# Patient Record
Sex: Male | Born: 1970 | Race: White | Hispanic: No | Marital: Married | Smoking: Never smoker
Health system: Southern US, Community
[De-identification: ages and names within clinical notes are randomized; demographics above are authoritative.]

## PROBLEM LIST (undated history)

## (undated) DIAGNOSIS — Z8719 Personal history of other diseases of the digestive system: Secondary | ICD-10-CM

## (undated) DIAGNOSIS — Z9889 Other specified postprocedural states: Secondary | ICD-10-CM

## (undated) DIAGNOSIS — S62609A Fracture of unspecified phalanx of unspecified finger, initial encounter for closed fracture: Secondary | ICD-10-CM

## (undated) DIAGNOSIS — D179 Benign lipomatous neoplasm, unspecified: Secondary | ICD-10-CM

## (undated) DIAGNOSIS — I861 Scrotal varices: Secondary | ICD-10-CM

## (undated) DIAGNOSIS — R079 Chest pain, unspecified: Secondary | ICD-10-CM

## (undated) DIAGNOSIS — M940 Chondrocostal junction syndrome [Tietze]: Secondary | ICD-10-CM

## (undated) HISTORY — PX: MANDIBLE FRACTURE SURGERY: SHX706

## (undated) HISTORY — DX: Chondrocostal junction syndrome (tietze): M94.0

## (undated) HISTORY — DX: Other specified postprocedural states: Z98.890

## (undated) HISTORY — DX: Benign lipomatous neoplasm, unspecified: D17.9

## (undated) HISTORY — DX: Scrotal varices: I86.1

## (undated) HISTORY — DX: Personal history of other diseases of the digestive system: Z87.19

## (undated) HISTORY — DX: Chest pain, unspecified: R07.9

---

## 2011-01-08 ENCOUNTER — Ambulatory Visit (HOSPITAL_BASED_OUTPATIENT_CLINIC_OR_DEPARTMENT_OTHER)
Admission: RE | Admit: 2011-01-08 | Discharge: 2011-01-08 | Disposition: A | Discharge status: Home / Self Care | Payer: BC Managed Care – PPO | Source: Ambulatory Visit | Attending: Orthopedic Surgery | Admitting: Orthopedic Surgery

## 2011-01-08 DIAGNOSIS — S61409A Unspecified open wound of unspecified hand, initial encounter: Secondary | ICD-10-CM | POA: Insufficient documentation

## 2011-01-08 DIAGNOSIS — W298XXA Contact with other powered powered hand tools and household machinery, initial encounter: Secondary | ICD-10-CM | POA: Insufficient documentation

## 2011-01-08 DIAGNOSIS — Z01812 Encounter for preprocedural laboratory examination: Secondary | ICD-10-CM | POA: Insufficient documentation

## 2011-01-08 LAB — POCT HEMOGLOBIN-HEMACUE: Hemoglobin: 14.9 g/dL (ref 13.0–17.0)

## 2011-01-12 NOTE — Op Note (Signed)
Jermaine Rice, Jermaine Rice         ACCOUNT NO.:  [id number]  MEDICAL RECORD NO.:  [id number]           Rice TYPE:  LOCATION:                                 FACILITY:  PHYSICIAN:  Betha Loa, MD             DATE OF BIRTH:  DATE OF PROCEDURE:  01/08/2011 DATE OF DISCHARGE:                              OPERATIVE REPORT   PREOPERATIVE DIAGNOSIS:  Left hand puncture wound.  POSTOPERATIVE DIAGNOSIS:  Left hand puncture wound.  PROCEDURE:  Irrigation and debridement of left hand.  SURGEON:  Betha Loa, MD  ASSISTANT:  Cindee Salt, MD  ANESTHESIA:  Bier block anesthesia.  FINDINGS:  Bleeding vein and small piece of plastic.  IV FLUIDS:  Per anesthesia flow sheet.  ESTIMATED BLOOD LOSS:  Minimal.  COMPLICATIONS:  None.  SPECIMENS:  None.  TOURNIQUET TIME:  27 minutes.  DISPOSITION:  Stable to PACU.  INDICATIONS:  Jermaine Rice is a 40 year old white male who was drilling a piece of plastic this morning when Jermaine drill bit slipped and poked into Jermaine back of his left hand.  He went to [facility] where he was evaluated.  He began to have swelling of Jermaine area around Jermaine wound after they attempted to irrigate and debride it.  I was consulted for evaluation.  He was brought to [facility] where he was evaluated.  He was noted to have a puncture wound on Jermaine dorsum of Jermaine hand.  He had intact sensation and capillary refill in all fingertips. He had good range of motion with good strength against resistance and no pain.  I recommended to Jermaine Rice going to Jermaine operating room for irrigation and debridement of Jermaine wound and repair of any associated injuries.  Risks, benefits, and alternatives of surgery were discussed and he wished to proceed.  OPERATIVE COURSE:  After being identified preoperatively by myself, Jermaine Rice and I agreed upon procedure and site of procedure.  Jermaine surgery site was marked.  Jermaine risks, benefits, and alternatives of surgery  were reviewed and he wished to proceed.  He was transferred to Jermaine operating room and placed on Jermaine operating table in a supine position with left upper extremity on arm board.  Bier block anesthesia was induced by Jermaine anesthesiologist.  Left upper extremity was prepped and draped in normal sterile orthopedic fashion.  Surgical pause was performed between surgeons, Anesthesia, operating staff, and all were in agreement as to Jermaine Rice procedure and site of procedure.  Jermaine wound was extended both proximally and distally.  There was a small piece of plastic sitting on top of one of Jermaine veins.  Jermaine vein also had a hole in Jermaine side of it.  This was cauterized using bipolar electrocautery device. Jermaine wound was thoroughly explored.  There was some small amount of hematoma in Jermaine tissues, but no associated injuries were noted.  Jermaine tendons to Jermaine index finger were intact.  Jermaine injury did not seem to go down to Jermaine bone or through Jermaine fascia.  Jermaine wound was copiously irrigated with 1000 mL of sterile saline.  Jermaine wound edges  were debrided of damaged skin.  Jermaine wound was closed with 4-0 nylon in a horizontal mattress fashion.  It was injected with 7 mL of 0.25% plain Marcaine to aid in postoperative analgesia.  Jermaine wound was dressed with sterile Xeroform, 4x4s, and wrapped with Kerlix and Ace bandage.  Jermaine tourniquet was deflated at 27 minutes.  Jermaine operative drapes were broken down.  Jermaine Rice was awoken from anesthesia safely.  He was transferred back to Jermaine stretcher and taken to Jermaine PACU in a stable condition.  I will see him back in Jermaine office in 1 week for postoperative followup.  I will give him Percocet 5/325 one to two p.o. q.6 h. p.r.n. pain, dispense #40 and Bactrim DS one p.o. b.i.d. x1 week.     Betha Loa, MD     KK/MEDQ  D:  01/08/2011  T:  01/09/2011  Job:  [id number]  Electronically Signed by Betha Loa  on 01/12/2011 03:02:42 PM
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
## 2012-03-06 ENCOUNTER — Other Ambulatory Visit: Payer: Self-pay | Admitting: Family Medicine

## 2012-03-06 ENCOUNTER — Ambulatory Visit
Admission: RE | Admit: 2012-03-06 | Discharge: 2012-03-06 | Disposition: A | Discharge status: Home / Self Care | Payer: BC Managed Care – PPO | Source: Ambulatory Visit | Attending: Family Medicine | Admitting: Family Medicine

## 2012-03-06 DIAGNOSIS — M79609 Pain in unspecified limb: Secondary | ICD-10-CM

## 2012-03-27 ENCOUNTER — Other Ambulatory Visit: Payer: Self-pay | Admitting: Family Medicine

## 2012-03-27 DIAGNOSIS — N5089 Other specified disorders of the male genital organs: Secondary | ICD-10-CM

## 2012-03-31 ENCOUNTER — Ambulatory Visit
Admission: RE | Admit: 2012-03-31 | Discharge: 2012-03-31 | Disposition: A | Discharge status: Home / Self Care | Payer: BC Managed Care – PPO | Source: Ambulatory Visit | Attending: Family Medicine | Admitting: Family Medicine

## 2012-03-31 ENCOUNTER — Other Ambulatory Visit: Payer: Self-pay | Admitting: Family Medicine

## 2012-03-31 DIAGNOSIS — N5089 Other specified disorders of the male genital organs: Secondary | ICD-10-CM

## 2015-03-15 ENCOUNTER — Encounter (HOSPITAL_COMMUNITY): Payer: Self-pay | Admitting: *Deleted

## 2015-03-15 ENCOUNTER — Emergency Department (HOSPITAL_COMMUNITY): Payer: BC Managed Care – PPO

## 2015-03-15 ENCOUNTER — Emergency Department (HOSPITAL_COMMUNITY)
Admission: EM | Admit: 2015-03-15 | Discharge: 2015-03-15 | Disposition: A | Discharge status: Home / Self Care | Payer: BC Managed Care – PPO | Attending: Emergency Medicine | Admitting: Emergency Medicine

## 2015-03-15 DIAGNOSIS — S299XXA Unspecified injury of thorax, initial encounter: Secondary | ICD-10-CM | POA: Insufficient documentation

## 2015-03-15 DIAGNOSIS — Y9389 Activity, other specified: Secondary | ICD-10-CM | POA: Diagnosis not present

## 2015-03-15 DIAGNOSIS — S60042A Contusion of left ring finger without damage to nail, initial encounter: Secondary | ICD-10-CM | POA: Insufficient documentation

## 2015-03-15 DIAGNOSIS — Y999 Unspecified external cause status: Secondary | ICD-10-CM | POA: Insufficient documentation

## 2015-03-15 DIAGNOSIS — Y929 Unspecified place or not applicable: Secondary | ICD-10-CM | POA: Insufficient documentation

## 2015-03-15 DIAGNOSIS — Z8781 Personal history of (healed) traumatic fracture: Secondary | ICD-10-CM | POA: Diagnosis not present

## 2015-03-15 DIAGNOSIS — S6992XA Unspecified injury of left wrist, hand and finger(s), initial encounter: Secondary | ICD-10-CM | POA: Diagnosis present

## 2015-03-15 DIAGNOSIS — W11XXXA Fall on and from ladder, initial encounter: Secondary | ICD-10-CM | POA: Diagnosis not present

## 2015-03-15 DIAGNOSIS — S62635A Displaced fracture of distal phalanx of left ring finger, initial encounter for closed fracture: Secondary | ICD-10-CM | POA: Diagnosis not present

## 2015-03-15 DIAGNOSIS — S62609A Fracture of unspecified phalanx of unspecified finger, initial encounter for closed fracture: Secondary | ICD-10-CM

## 2015-03-15 DIAGNOSIS — M549 Dorsalgia, unspecified: Secondary | ICD-10-CM

## 2015-03-15 LAB — URINALYSIS, ROUTINE W REFLEX MICROSCOPIC
BILIRUBIN URINE: NEGATIVE
Glucose, UA: NEGATIVE mg/dL
HGB URINE DIPSTICK: NEGATIVE
Ketones, ur: 15 mg/dL — AB
Leukocytes, UA: NEGATIVE
Nitrite: NEGATIVE
PH: 6 (ref 5.0–8.0)
Protein, ur: NEGATIVE mg/dL
SPECIFIC GRAVITY, URINE: 1.027 (ref 1.005–1.030)
Urobilinogen, UA: 0.2 mg/dL (ref 0.0–1.0)

## 2015-03-15 LAB — PROTIME-INR
INR: 1.07 (ref 0.00–1.49)
PROTHROMBIN TIME: 14.1 s (ref 11.6–15.2)

## 2015-03-15 LAB — COMPREHENSIVE METABOLIC PANEL
ALT: 18 U/L (ref 17–63)
AST: 35 U/L (ref 15–41)
Albumin: 4.1 g/dL (ref 3.5–5.0)
Alkaline Phosphatase: 64 U/L (ref 38–126)
Anion gap: 7 (ref 5–15)
BILIRUBIN TOTAL: 0.4 mg/dL (ref 0.3–1.2)
BUN: 16 mg/dL (ref 6–20)
CHLORIDE: 106 mmol/L (ref 101–111)
CO2: 28 mmol/L (ref 22–32)
CREATININE: 0.99 mg/dL (ref 0.61–1.24)
Calcium: 9.6 mg/dL (ref 8.9–10.3)
Glucose, Bld: 84 mg/dL (ref 65–99)
Potassium: 4.6 mmol/L (ref 3.5–5.1)
Sodium: 141 mmol/L (ref 135–145)
Total Protein: 6.6 g/dL (ref 6.5–8.1)

## 2015-03-15 LAB — CBC
HCT: 40.5 % (ref 39.0–52.0)
Hemoglobin: 13.9 g/dL (ref 13.0–17.0)
MCH: 30.3 pg (ref 26.0–34.0)
MCHC: 34.3 g/dL (ref 30.0–36.0)
MCV: 88.2 fL (ref 78.0–100.0)
PLATELETS: 175 10*3/uL (ref 150–400)
RBC: 4.59 MIL/uL (ref 4.22–5.81)
RDW: 12.6 % (ref 11.5–15.5)
WBC: 6.3 10*3/uL (ref 4.0–10.5)

## 2015-03-15 LAB — ETHANOL: Alcohol, Ethyl (B): <5 mg/dL (ref <5)

## 2015-03-15 MED ORDER — IBUPROFEN 800 MG PO TABS: 800.0000 mg | ORAL_TABLET | Freq: Three times a day (TID) | ORAL | Status: DC

## 2015-03-15 MED ORDER — METHOCARBAMOL 500 MG PO TABS: 500.0000 mg | ORAL_TABLET | Freq: Two times a day (BID) | ORAL | Status: DC

## 2015-03-15 MED ORDER — HYDROCODONE-ACETAMINOPHEN 5-325 MG PO TABS: 2.0000 | ORAL_TABLET | ORAL | Status: DC | PRN

## 2015-03-15 MED ORDER — OXYCODONE-ACETAMINOPHEN 5-325 MG PO TABS
1.0000 | ORAL_TABLET | Freq: Once | ORAL | Status: AC
Start: 2015-03-15 — End: 2015-03-15
  Administered 2015-03-15: 1 via ORAL

## 2015-03-15 MED ORDER — OXYCODONE-ACETAMINOPHEN 5-325 MG PO TABS
ORAL_TABLET | ORAL | Status: AC
  Filled 2015-03-15: qty 1

## 2015-03-15 MED ORDER — ONDANSETRON HCL 4 MG PO TABS: 4.0000 mg | ORAL_TABLET | Freq: Once | ORAL | Status: DC

## 2015-03-15 NOTE — ED Notes (Signed)
Pt in stating he fell off of a ladder, approx 15 feet, landed on his back, states something was falling with him that he hit his hand on, c/o pain to left ring finger, c/o pain to back, denies LOC or hitting his head, alert and oriented

## 2015-03-15 NOTE — Progress Notes (Signed)
Orthopedic Tech Progress Note Patient Details:  Jermaine Rice Aug 06, 1971 [id number]  Ortho Devices Type of Ortho Device: Finger splint Ortho Device/Splint Location: LUE Ortho Device/Splint Interventions: Ordered, Application   Braulio Bosch 03/15/2015, 8:51 PM

## 2015-03-15 NOTE — Discharge Instructions (Signed)
Finger Fracture Fractures of fingers are breaks in the bones of the fingers. There are many types of fractures. There are different ways of treating these fractures. Your health care provider will discuss the best way to treat your fracture. CAUSES Traumatic injury is the main cause of broken fingers. These include:  Injuries while playing sports.  Workplace injuries.  Falls. RISK FACTORS Activities that can increase your risk of finger fractures include:  Sports.  Workplace activities that involve machinery.  A condition called osteoporosis, which can make your bones less dense and cause them to fracture more easily. SIGNS AND SYMPTOMS The main symptoms of a broken finger are pain and swelling within 15 minutes after the injury. Other symptoms include:  Bruising of your finger.  Stiffness of your finger.  Numbness of your finger.  Exposed bones (compound fracture) if the fracture is severe. DIAGNOSIS  The best way to diagnose a broken bone is with X-ray imaging. Additionally, your health care provider will use this X-ray image to evaluate the position of the broken finger bones.  TREATMENT  Finger fractures can be treated with:   Nonreduction--This means the bones are in place. The finger is splinted without changing the positions of the bone pieces. The splint is usually left on for about a week to 10 days. This will depend on your fracture and what your health care provider thinks.  Closed reduction--The bones are put back into position without using surgery. The finger is then splinted.  Open reduction and internal fixation--The fracture site is opened. Then the bone pieces are fixed into place with pins or some type of hardware. This is seldom required. It depends on the severity of the fracture. HOME CARE INSTRUCTIONS   Follow your health care provider's instructions regarding activities, exercises, and physical therapy.  Only take over-the-counter or prescription  medicines for pain, discomfort, or fever as directed by your health care provider. SEEK MEDICAL CARE IF: You have pain or swelling that limits the motion or use of your fingers. SEEK IMMEDIATE MEDICAL CARE IF:  Your finger becomes numb. MAKE SURE YOU:   Understand these instructions.  Will watch your condition.  Will get help right away if you are not doing well or get worse. Document Released: 12/29/2000 Document Revised: 07/07/2013 Document Reviewed: 04/28/2013 [facility] Patient Information 2015 [place], [place]. This information is not intended to replace advice given to you by your health care provider. Make sure you discuss any questions you have with your health care provider.  Back Pain, Adult Low back pain is very common. About 1 in 5 people have back pain.The cause of low back pain is rarely dangerous. The pain often gets better over time.About half of people with a sudden onset of back pain feel better in just 2 weeks. About 8 in 10 people feel better by 6 weeks.  CAUSES Some common causes of back pain include:  Strain of the muscles or ligaments supporting the spine.  Wear and tear (degeneration) of the spinal discs.  Arthritis.  Direct injury to the back. DIAGNOSIS Most of the time, the direct cause of low back pain is not known.However, back pain can be treated effectively even when the exact cause of the pain is unknown.Answering your caregiver's questions about your overall health and symptoms is one of the most accurate ways to make sure the cause of your pain is not dangerous. If your caregiver needs more information, he or she may order lab work or imaging tests (X-rays or  MRIs).However, even if imaging tests show changes in your back, this usually does not require surgery. HOME CARE INSTRUCTIONS For many people, back pain returns.Since low back pain is rarely dangerous, it is often a condition that people can learn to [facility] their own.   Remain active. It is  stressful on the back to sit or stand in one place. Do not sit, drive, or stand in one place for more than 30 minutes at a time. Take short walks on level surfaces as soon as pain allows.Try to increase the length of time you walk each day.  Do not stay in bed.Resting more than 1 or 2 days can delay your recovery.  Do not avoid exercise or work.Your body is made to move.It is not dangerous to be active, even though your back may hurt.Your back will likely heal faster if you return to being active before your pain is gone.  Pay attention to your body when you bend and lift. Many people have less discomfortwhen lifting if they bend their knees, keep the load close to their bodies,and avoid twisting. Often, the most comfortable positions are those that put less stress on your recovering back.  Find a comfortable position to sleep. Use a firm mattress and lie on your side with your knees slightly bent. If you lie on your back, put a pillow under your knees.  Only take over-the-counter or prescription medicines as directed by your caregiver. Over-the-counter medicines to reduce pain and inflammation are often the most helpful.Your caregiver may prescribe muscle relaxant drugs.These medicines help dull your pain so you can more quickly return to your normal activities and healthy exercise.  Put ice on the injured area.  Put ice in a plastic bag.  Place a towel between your skin and the bag.  Leave the ice on for 15-20 minutes, 03-04 times a day for the first 2 to 3 days. After that, ice and heat may be alternated to reduce pain and spasms.  Ask your caregiver about trying back exercises and gentle massage. This may be of some benefit.  Avoid feeling anxious or stressed.Stress increases muscle tension and can worsen back pain.It is important to recognize when you are anxious or stressed and learn ways to manage it.Exercise is a great option. SEEK MEDICAL CARE IF:  You have pain that is  not relieved with rest or medicine.  You have pain that does not improve in 1 week.  You have new symptoms.  You are generally not feeling well. SEEK IMMEDIATE MEDICAL CARE IF:   You have pain that radiates from your back into your legs.  You develop new bowel or bladder control problems.  You have unusual weakness or numbness in your arms or legs.  You develop nausea or vomiting.  You develop abdominal pain.  You feel faint. Document Released: 09/16/2005 Document Revised: 03/17/2012 Document Reviewed: 01/18/2014 [facility] Patient Information 2015 [place], [place]. This information is not intended to replace advice given to you by your health care provider. Make sure you discuss any questions you have with your health care provider.

## 2015-03-15 NOTE — ED Notes (Signed)
Pt. Left with all belongings and refused wheelchair 

## 2015-03-15 NOTE — ED Provider Notes (Signed)
CSN: [id number]     Arrival date & time 03/15/15  1616 History   First MD Initiated Contact with Patient 03/15/15 1900     Chief Complaint  Patient presents with  . Fall     (Consider location/radiation/quality/duration/timing/severity/associated sxs/prior Treatment) HPI   Jermaine Rice is a 44 year old male, otherwise healthy, who came to the ER this evening after falling from a ladder, from about 10-15 feet high, onto his back in newly placed mulch.  He had been building a playset and was holding a large heavy piece above his head when it caused him to fall backward.  He was able to throw the piece away from his body so that it didn't fall on top of him.  He noticed his ring finger on his left hand was deformed, and he reduced it himself. He was able to get up and walk after the fall. He had intermittent back and finger pain that was severe. When his wife arrived home they decided to come into the emergency department for the broken finger and for the intense pain that gradually was getting worse. He does not have any chest pain, no difficulty breathing, no abdominal pain or distention, no syncopal episode.  He denies hitting his head and denies loss of consciousness, has no visual changes and has had no nausea or vomiting.  He has full range of motion of his neck, back, hips, shoulders, arms and legs and main complaints are increasing stiffness in his back.  History reviewed. No pertinent past medical history. History reviewed. No pertinent past surgical history. History reviewed. No pertinent family history. History  Substance Use Topics  . Smoking status: Never Smoker   . Smokeless tobacco: Not on file  . Alcohol Use: Not on file    Review of Systems  All other systems reviewed and are negative.  Allergies  Review of patient's allergies indicates no known allergies.  Home Medications   Prior to Admission medications   Medication Sig Start Date End Date Taking?  Authorizing Provider  HYDROcodone-acetaminophen (NORCO/VICODIN) 5-325 MG per tablet Take 2 tablets by mouth every 4 (four) hours as needed. 03/15/15   Delsa Grana, PA-C  ibuprofen (ADVIL,MOTRIN) 800 MG tablet Take 1 tablet (800 mg total) by mouth 3 (three) times daily. 03/15/15   Delsa Grana, PA-C  methocarbamol (ROBAXIN) 500 MG tablet Take 1 tablet (500 mg total) by mouth 2 (two) times daily. 03/15/15   Delsa Grana, PA-C   BP 123/80 mmHg  Pulse 54  Temp(Src) 98 F (36.7 C) (Oral)  Resp 20  Ht 5\' 10"  (1.778 m)  Wt 155 lb (70.308 kg)  BMI 22.24 kg/m2  SpO2 100% Physical Exam  Constitutional: He is oriented to person, place, and time. He appears well-developed and well-nourished. No distress.  HENT:  Head: Normocephalic and atraumatic.  Nose: Nose normal.  Eyes: Conjunctivae and EOM are normal. Pupils are equal, round, and reactive to light. No scleral icterus.  Neck: Normal range of motion. Neck supple.  Cardiovascular: Normal rate, regular rhythm and normal heart sounds.  Exam reveals no gallop and no friction rub.   No murmur heard. Pulmonary/Chest: Effort normal and breath sounds normal. No respiratory distress. He has no wheezes. He has no rales. He exhibits no tenderness.  Abdominal: Soft. Bowel sounds are normal. He exhibits no distension. There is no tenderness. There is no rebound and no guarding.  Musculoskeletal: Normal range of motion.       Cervical back: He exhibits normal range  of motion, no tenderness, no bony tenderness, no swelling, no edema, no deformity, no pain and no spasm.       Thoracic back: He exhibits tenderness. He exhibits normal range of motion, no bony tenderness, no swelling, no edema, no deformity, no laceration, no pain and no spasm.       Lumbar back: He exhibits normal range of motion, no tenderness, no bony tenderness, no swelling, no edema, no deformity, no laceration, no pain and no spasm.  Mild thoracic paraspinal tenderness to palpation Left ring  finger, distal phalanx edematous with ecchymosis, no deformity, normal cap refill, normal sensation   Neurological: He is alert and oriented to person, place, and time. No cranial nerve deficit. He exhibits normal muscle tone. Coordination normal.  Skin: Skin is warm and dry. No rash noted. He is not diaphoretic. No erythema. No pallor.  Psychiatric: He has a normal mood and affect. His behavior is normal. Judgment and thought content normal.      ED Course  Procedures (including critical care time) Labs Review Labs Reviewed  COMPREHENSIVE METABOLIC PANEL  CBC  ETHANOL  PROTIME-INR  URINALYSIS, ROUTINE W REFLEX MICROSCOPIC (NOT AT [facility])    Imaging Review Dg Cervical Spine Complete  03/15/2015   CLINICAL DATA:  44 year old male status post 15 foot fall from ladder onto ground. Patient landed on back. Cervical spine and back pain.  EXAM: CERVICAL SPINE  4+ VIEWS  COMPARISON:  Concurrently obtained radiographs of the thoracic spine  FINDINGS: There is no evidence of cervical spine fracture or prevertebral soft tissue swelling. Alignment is normal. No other significant bone abnormalities are identified.  IMPRESSION: Negative cervical spine radiographs.   Electronically Signed   By: Jacqulynn Cadet M.D.   On: 03/15/2015 17:11   Dg Thoracic Spine 2 View  03/15/2015   CLINICAL DATA:  44 year old male status post 15 foot fall from ladder onto the ground.  EXAM: THORACIC SPINE - 2 VIEW  COMPARISON:  Concurrently obtained radiographs of the lumbar and cervical spine.  FINDINGS: There is no evidence of thoracic spine fracture. Alignment is normal. No other significant bone abnormalities are identified.  IMPRESSION: Negative.   Electronically Signed   By: Jacqulynn Cadet M.D.   On: 03/15/2015 17:14   Dg Lumbar Spine Complete  03/15/2015   CLINICAL DATA:  44 year old male status post fall 15 feet from ladder. Lumbar spine pain.  EXAM: LUMBAR SPINE - COMPLETE 4+ VIEW  COMPARISON:  None.  FINDINGS:  There is no evidence of lumbar spine fracture. Alignment is normal. Intervertebral disc spaces are maintained.  IMPRESSION: Negative.   Electronically Signed   By: Jacqulynn Cadet M.D.   On: 03/15/2015 17:13   Dg Finger Ring Left  03/15/2015   CLINICAL DATA:  44 year old male status post fall 15 feet from ladder  EXAM: LEFT RING FINGER 2+V  COMPARISON:  None.  FINDINGS: Acute fracture through the central aspect of the distal phalanx of the fourth finger. The distal fracture fragment is displaced dorsally by approximately 1.5 mm. There is associated soft tissue swelling. The remaining visualized bones and joints are unremarkable.  IMPRESSION: Mildly displaced fracture through the distal phalanx of the ring finger.   Electronically Signed   By: Jacqulynn Cadet M.D.   On: 03/15/2015 17:12     EKG Interpretation None      MDM   Final diagnoses:  Accidental fall from ladder, initial encounter  Bilateral back pain, unspecified location  Finger fracture, closed, initial encounter  Pt had a fall from approximately 10-15 feet from a ladder onto his back in newly placed mulch.  He has diffused back pain and tightness and left hand ring finger was visibly deformed, which he reduced on his own, presented to ER for pain.    Mildly displaced fracture of 4th distal phalanx on left hand.  Will splint get follow up with hand.  All other films negative for fracture.  Pt's pain is decreased with percocet.  In no distress.  No chest pain, SOB, abdominal pain.  Full range of motion. UA to check for hematuria and renal injury.    Pending UA, will discharge home with Hand f/u and NSAIDS and muscle relaxers.  Delsa Grana, PA-C 9:04 PM  Urine resulted without evidence of hematuria.  Pt ready for discharge, vitals stable.     Delsa Grana, PA-C 03/15/15 [id number]  Ernestina Patches, MD 03/17/15 [phone]
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
## 2015-03-17 ENCOUNTER — Encounter (HOSPITAL_BASED_OUTPATIENT_CLINIC_OR_DEPARTMENT_OTHER): Payer: Self-pay | Admitting: *Deleted

## 2015-03-20 ENCOUNTER — Ambulatory Visit (HOSPITAL_BASED_OUTPATIENT_CLINIC_OR_DEPARTMENT_OTHER): Payer: BC Managed Care – PPO | Admitting: Anesthesiology

## 2015-03-20 ENCOUNTER — Encounter (HOSPITAL_BASED_OUTPATIENT_CLINIC_OR_DEPARTMENT_OTHER)
Admission: RE | Disposition: A | Discharge status: Home / Self Care | Payer: Self-pay | Source: Ambulatory Visit | Attending: Orthopedic Surgery

## 2015-03-20 ENCOUNTER — Encounter (HOSPITAL_BASED_OUTPATIENT_CLINIC_OR_DEPARTMENT_OTHER): Payer: Self-pay

## 2015-03-20 ENCOUNTER — Ambulatory Visit (HOSPITAL_BASED_OUTPATIENT_CLINIC_OR_DEPARTMENT_OTHER)
Admission: RE | Admit: 2015-03-20 | Discharge: 2015-03-20 | Disposition: A | Discharge status: Home / Self Care | Payer: BC Managed Care – PPO | Source: Ambulatory Visit | Attending: Orthopedic Surgery | Admitting: Orthopedic Surgery

## 2015-03-20 ENCOUNTER — Ambulatory Visit (HOSPITAL_COMMUNITY): Payer: BC Managed Care – PPO

## 2015-03-20 DIAGNOSIS — S62635A Displaced fracture of distal phalanx of left ring finger, initial encounter for closed fracture: Secondary | ICD-10-CM | POA: Diagnosis not present

## 2015-03-20 DIAGNOSIS — W132XXA Fall from, out of or through roof, initial encounter: Secondary | ICD-10-CM | POA: Insufficient documentation

## 2015-03-20 DIAGNOSIS — Y939 Activity, unspecified: Secondary | ICD-10-CM | POA: Diagnosis not present

## 2015-03-20 DIAGNOSIS — Y999 Unspecified external cause status: Secondary | ICD-10-CM | POA: Insufficient documentation

## 2015-03-20 DIAGNOSIS — Y92099 Unspecified place in other non-institutional residence as the place of occurrence of the external cause: Secondary | ICD-10-CM | POA: Diagnosis not present

## 2015-03-20 DIAGNOSIS — Z4789 Encounter for other orthopedic aftercare: Secondary | ICD-10-CM

## 2015-03-20 HISTORY — PX: CLOSED REDUCTION FINGER WITH PERCUTANEOUS PINNING: SHX5612

## 2015-03-20 HISTORY — DX: Fracture of unspecified phalanx of unspecified finger, initial encounter for closed fracture: S62.609A

## 2015-03-20 LAB — POCT HEMOGLOBIN-HEMACUE: Hemoglobin: 15.2 g/dL (ref 13.0–17.0)

## 2015-03-20 SURGERY — CLOSED REDUCTION, FINGER, WITH PERCUTANEOUS PINNING
Anesthesia: Monitor Anesthesia Care | Site: Finger | Laterality: Left

## 2015-03-20 MED ORDER — BUPIVACAINE-EPINEPHRINE 0.5% -1:200000 IJ SOLN
INTRAMUSCULAR | Status: DC | PRN
Start: 2015-03-20 — End: 2015-03-20
  Administered 2015-03-20: 5 mL

## 2015-03-20 MED ORDER — LIDOCAINE HCL (CARDIAC) 20 MG/ML IV SOLN
INTRAVENOUS | Status: DC | PRN
  Administered 2015-03-20: 50 mg via INTRAVENOUS

## 2015-03-20 MED ORDER — LIDOCAINE HCL 2 % IJ SOLN
INTRAMUSCULAR | Status: DC | PRN
  Administered 2015-03-20: 5 mL

## 2015-03-20 MED ORDER — FENTANYL CITRATE (PF) 100 MCG/2ML IJ SOLN
INTRAMUSCULAR | Status: AC
  Filled 2015-03-20: qty 6

## 2015-03-20 MED ORDER — LACTATED RINGERS IV SOLN
INTRAVENOUS | Status: DC
  Administered 2015-03-20: 09:00:00 via INTRAVENOUS

## 2015-03-20 MED ORDER — HYDROCODONE-ACETAMINOPHEN 5-325 MG PO TABS: 1.0000 | ORAL_TABLET | ORAL | Status: DC | PRN

## 2015-03-20 MED ORDER — PROPOFOL INFUSION 10 MG/ML OPTIME
INTRAVENOUS | Status: DC | PRN
  Administered 2015-03-20: 70 ug/kg/min via INTRAVENOUS

## 2015-03-20 MED ORDER — SCOPOLAMINE 1 MG/3DAYS TD PT72: 1.0000 | MEDICATED_PATCH | Freq: Once | TRANSDERMAL | Status: DC | PRN

## 2015-03-20 MED ORDER — ONDANSETRON HCL 4 MG/2ML IJ SOLN
INTRAMUSCULAR | Status: DC | PRN
  Administered 2015-03-20: 4 mg via INTRAVENOUS

## 2015-03-20 MED ORDER — LIDOCAINE HCL 2 % IJ SOLN
INTRAMUSCULAR | Status: AC
  Filled 2015-03-20: qty 20

## 2015-03-20 MED ORDER — FENTANYL CITRATE (PF) 100 MCG/2ML IJ SOLN
50.0000 ug | INTRAMUSCULAR | Status: DC | PRN
  Administered 2015-03-20 (×2): 50 ug via INTRAVENOUS

## 2015-03-20 MED ORDER — MIDAZOLAM HCL 2 MG/2ML IJ SOLN
INTRAMUSCULAR | Status: AC
  Filled 2015-03-20: qty 2

## 2015-03-20 MED ORDER — MIDAZOLAM HCL 2 MG/2ML IJ SOLN
1.0000 mg | INTRAMUSCULAR | Status: DC | PRN
  Administered 2015-03-20: 2 mg via INTRAVENOUS

## 2015-03-20 MED ORDER — GLYCOPYRROLATE 0.2 MG/ML IJ SOLN: 0.2000 mg | Freq: Once | INTRAMUSCULAR | Status: DC | PRN

## 2015-03-20 SURGICAL SUPPLY — 45 items
BANDAGE COBAN STERILE 2 (GAUZE/BANDAGES/DRESSINGS) IMPLANT
BLADE SURG 15 STRL LF DISP TIS (BLADE) ×1 IMPLANT
BLADE SURG 15 STRL SS (BLADE) ×2
BNDG COHESIVE 1X5 TAN STRL LF (GAUZE/BANDAGES/DRESSINGS) ×3 IMPLANT
BNDG COHESIVE 4X5 TAN STRL (GAUZE/BANDAGES/DRESSINGS) IMPLANT
BNDG CONFORM 2 STRL LF (GAUZE/BANDAGES/DRESSINGS) IMPLANT
BNDG ESMARK 4X9 LF (GAUZE/BANDAGES/DRESSINGS) IMPLANT
BNDG GAUZE ELAST 4 BULKY (GAUZE/BANDAGES/DRESSINGS) IMPLANT
CHLORAPREP W/TINT 26ML (MISCELLANEOUS) ×3 IMPLANT
CORDS BIPOLAR (ELECTRODE) IMPLANT
COVER BACK TABLE 60X90IN (DRAPES) ×3 IMPLANT
COVER MAYO STAND STRL (DRAPES) ×3 IMPLANT
CUFF TOURNIQUET SINGLE 18IN (TOURNIQUET CUFF) ×3 IMPLANT
DRAPE C-ARM 42X72 X-RAY (DRAPES) ×3 IMPLANT
DRAPE EXTREMITY T 121X128X90 (DRAPE) ×3 IMPLANT
DRAPE SURG 17X23 STRL (DRAPES) ×3 IMPLANT
DRSG EMULSION OIL 3X3 NADH (GAUZE/BANDAGES/DRESSINGS) ×3 IMPLANT
GAUZE SPONGE 4X4 12PLY STRL (GAUZE/BANDAGES/DRESSINGS) ×3 IMPLANT
GLOVE BIO SURGEON STRL SZ7.5 (GLOVE) ×3 IMPLANT
GLOVE BIOGEL PI IND STRL 7.0 (GLOVE) ×2 IMPLANT
GLOVE BIOGEL PI IND STRL 8 (GLOVE) ×1 IMPLANT
GLOVE BIOGEL PI INDICATOR 7.0 (GLOVE) ×4
GLOVE BIOGEL PI INDICATOR 8 (GLOVE) ×2
GLOVE ECLIPSE 6.5 STRL STRAW (GLOVE) ×9 IMPLANT
GOWN STRL REUS W/ TWL LRG LVL3 (GOWN DISPOSABLE) ×2 IMPLANT
GOWN STRL REUS W/TWL LRG LVL3 (GOWN DISPOSABLE) ×4
GOWN STRL REUS W/TWL XL LVL3 (GOWN DISPOSABLE) ×6 IMPLANT
K-WIRE .045 CH (WIRE) ×3
KWIRE .045 CH (WIRE) ×1 IMPLANT
NEEDLE HYPO 25X1 1.5 SAFETY (NEEDLE) ×3 IMPLANT
NS IRRIG 1000ML POUR BTL (IV SOLUTION) ×3 IMPLANT
PACK BASIN DAY SURGERY FS (CUSTOM PROCEDURE TRAY) ×3 IMPLANT
PADDING CAST ABS 4INX4YD NS (CAST SUPPLIES)
PADDING CAST ABS COTTON 4X4 ST (CAST SUPPLIES) IMPLANT
PADDING UNDERCAST 2 STRL (CAST SUPPLIES)
PADDING UNDERCAST 2X4 STRL (CAST SUPPLIES) IMPLANT
STOCKINETTE 6  STRL (DRAPES) ×2
STOCKINETTE 6 STRL (DRAPES) ×1 IMPLANT
SUT VICRYL RAPIDE 4-0 (SUTURE) IMPLANT
SUT VICRYL RAPIDE 4/0 PS 2 (SUTURE) IMPLANT
SYR BULB 3OZ (MISCELLANEOUS) IMPLANT
SYRINGE 10CC LL (SYRINGE) ×3 IMPLANT
TOWEL OR 17X24 6PK STRL BLUE (TOWEL DISPOSABLE) ×3 IMPLANT
TOWEL OR NON WOVEN STRL DISP B (DISPOSABLE) IMPLANT
UNDERPAD 30X30 (UNDERPADS AND DIAPERS) ×3 IMPLANT

## 2015-03-20 NOTE — Interval H&P Note (Signed)
History and Physical Interval Note:  03/20/2015 7:33 AM  Jermaine Rice  has presented today for surgery, with the diagnosis of closed fracture left ring finger distal phalanx  The various methods of treatment have been discussed with the patient and family. After consideration of risks, benefits and other options for treatment, the patient has consented to  Procedure(s): CLOSED REDUCTION FINGER WITH PERCUTANEOUS PINNING LEFT RING FINGER (Left) as a surgical intervention .  The patient's history has been reviewed, patient examined, no change in status, stable for surgery.  I have reviewed the patient's chart and labs.  Questions were answered to the patient's satisfaction.     [name]

## 2015-03-20 NOTE — Op Note (Signed)
03/20/2015  8:40 AM  PATIENT:  Jermaine Rice  44 y.o. male  PRE-OPERATIVE DIAGNOSIS:  Left ring finger displaced distal phalanx fracture  POST-OPERATIVE DIAGNOSIS:  Same  PROCEDURE:  CRP P left ring finger P3 fracture  SURGEON: Rayvon Char. Grandville Silos, MD  PHYSICIAN ASSISTANT: Morley Kos, PA-C  ANESTHESIA:  local and MAC  SPECIMENS:  None  DRAINS:   None  EBL:  less than 50 mL  PREOPERATIVE INDICATIONS:  Jermaine Rice is a  44 y.o. male with displaced shaft fracture of the distal phalanx of left ring finger.  The risks benefits and alternatives were discussed with the patient preoperatively including but not limited to the risks of infection, bleeding, nerve injury, cardiopulmonary complications, the need for revision surgery, among others, and the patient verbalized understanding and consented to proceed.  OPERATIVE IMPLANTS: 0.045 inch K wires 2  OPERATIVE PROCEDURE:  After receiving prophylactic antibiotics, the patient was escorted to the operative theatre and placed in a supine position.  A digital block was instilled at the base of the digit by me with local anesthetic.A surgical "time-out" was performed during which the planned procedure, proposed operative site, and the correct patient identity were compared to the operative consent and agreement confirmed by the circulating nurse according to current facility policy.  Following application of a tourniquet to the operative extremity, the exposed skin was prepped with Chloraprep and draped in the usual sterile fashion.    The fracture was reduced under fluoroscopy, and a 0.045 inch K wire driven from distal to proximal, longitudinally, traversing the fracture with the fracture in much better alignment and proximity. A second K wire was then driven from proximal to distal but coursing somewhat obliquely from ulnar to radial and same time, providing additional stability. The K wires were not within the  DIP joint. Final fluoroscopic images were obtained. The K wires were bent over to lie alongside the nail plate and clipped. In order to help keep them from becoming dislodged, they were secondarily secured with benzoin and Steri-Strips as well. A light finger dressing with a dorsal aluminum splint component was applied, allowing the MP and PIP joints to flex. He was taken to the recovery room.  DISPOSITION: He will be discharged home, returning to the office later this week to see therapy to have a custom protective splint made.

## 2015-03-20 NOTE — Transfer of Care (Signed)
Immediate Anesthesia Transfer of Care Note  Patient: Jermaine Rice  Procedure(s) Performed: Procedure(s): CLOSED REDUCTION FINGER WITH PERCUTANEOUS PINNING LEFT RING FINGER (Left)  Patient Location: PACU  Anesthesia Type:MAC  Level of Consciousness: sedated and patient cooperative  Airway & Oxygen Therapy: Patient Spontanous Breathing and Patient connected to face mask oxygen  Post-op Assessment: Report given to RN and Post -op Vital signs reviewed and stable  Post vital signs: Reviewed and stable  Last Vitals:  Filed Vitals:   03/20/15 0821  BP: 121/86  Pulse: 50  Temp: 36.5 C  Resp: 16    Complications: No apparent anesthesia complications

## 2015-03-20 NOTE — Anesthesia Preprocedure Evaluation (Signed)
Anesthesia Evaluation  Patient identified by MRN, date of birth, ID band Patient awake    Reviewed: Allergy & Precautions, NPO status , Patient's Chart, lab work & pertinent test results  Airway Mallampati: I  TM Distance: >3 FB Neck ROM: Full    Dental  (+) Teeth Intact, Dental Advisory Given   Pulmonary  breath sounds clear to auscultation        Cardiovascular Rhythm:Regular Rate:Normal     Neuro/Psych    GI/Hepatic   Endo/Other    Renal/GU      Musculoskeletal   Abdominal   Peds  Hematology   Anesthesia Other Findings   Reproductive/Obstetrics                             Anesthesia Physical Anesthesia Plan  ASA: I  Anesthesia Plan: MAC   Post-op Pain Management:    Induction: Intravenous  Airway Management Planned: Simple Face Mask and LMA  Additional Equipment:   Intra-op Plan:   Post-operative Plan: Extubation in OR  Informed Consent: I have reviewed the patients History and Physical, chart, labs and discussed the procedure including the risks, benefits and alternatives for the proposed anesthesia with the patient or authorized representative who has indicated his/her understanding and acceptance.   Dental advisory given  Plan Discussed with: CRNA, Anesthesiologist and Surgeon  Anesthesia Plan Comments:         Anesthesia Quick Evaluation

## 2015-03-20 NOTE — Discharge Instructions (Signed)
Discharge Instructions   You have a dressing with a splint incorporated within it. Move your fingers as much as possible, making a full fist and fully opening the fist. Elevate your hand to reduce pain & swelling of the digits.  Ice over the operative site may be helpful to reduce pain & swelling.  DO NOT USE HEAT. Pain medicine has been prescribed for you.  Use your medicine as needed over the first 48 hours, and then you can begin to taper your use.  You may use Tylenol in place of your prescribed pain medication, but not IN ADDITION to it. Leave the dressing in place until you return to our office.  You may shower, but keep the bandage on,  clean & dry.  You may drive a car when you are off of prescription pain medications and can safely control your vehicle with both hands. Our office will call you to arrange follow-up   Please call [phone] during normal business hours or [phone] after hours for any problems. Including the following:  - excessive redness of the incisions - drainage for more than 4 days - fever of more than 101.5 F  *Please note that pain medications will not be refilled after hours or on weekends.   Post Anesthesia Home Care Instructions  Activity: Get plenty of rest for the remainder of the day. A responsible adult should stay with you for 24 hours following the procedure.  For the next 24 hours, DO NOT: -Drive a car -Paediatric nurse -Drink alcoholic beverages -Take any medication unless instructed by your physician -Make any legal decisions or sign important papers.  Meals: Start with liquid foods such as gelatin or soup. Progress to regular foods as tolerated. Avoid greasy, spicy, heavy foods. If nausea and/or vomiting occur, drink only clear liquids until the nausea and/or vomiting subsides. Call your physician if vomiting continues.  Special Instructions/Symptoms: Your throat may feel dry or sore from the anesthesia or the breathing tube  placed in your throat during surgery. If this causes discomfort, gargle with warm salt water. The discomfort should disappear within 24 hours.  If you had a scopolamine patch placed behind your ear for the management of post- operative nausea and/or vomiting:  1. The medication in the patch is effective for 72 hours, after which it should be removed.  Wrap patch in a tissue and discard in the trash. Wash hands thoroughly with soap and water. 2. You may remove the patch earlier than 72 hours if you experience unpleasant side effects which may include dry mouth, dizziness or visual disturbances. 3. Avoid touching the patch. Wash your hands with soap and water after contact with the patch.

## 2015-03-20 NOTE — Anesthesia Postprocedure Evaluation (Signed)
  Anesthesia Post-op Note  Patient: Jermaine Rice  Procedure(s) Performed: Procedure(s): CLOSED REDUCTION FINGER WITH PERCUTANEOUS PINNING LEFT RING FINGER (Left)  Patient Location: PACU  Anesthesia Type: MAC   Level of Consciousness: awake, alert  and oriented  Airway and Oxygen Therapy: Patient Spontanous Breathing  Post-op Pain: none  Post-op Assessment: Post-op Vital signs reviewed  Post-op Vital Signs: Reviewed  Last Vitals:  Filed Vitals:   03/20/15 0930  BP: 99/58  Pulse: 43  Temp:   Resp: 11    Complications: No apparent anesthesia complications

## 2015-03-22 ENCOUNTER — Encounter (HOSPITAL_BASED_OUTPATIENT_CLINIC_OR_DEPARTMENT_OTHER): Payer: Self-pay | Admitting: Orthopedic Surgery

## 2015-05-01 DIAGNOSIS — Z8719 Personal history of other diseases of the digestive system: Secondary | ICD-10-CM

## 2015-05-01 DIAGNOSIS — Z9889 Other specified postprocedural states: Secondary | ICD-10-CM

## 2015-05-01 HISTORY — DX: Other specified postprocedural states: Z98.890

## 2015-05-01 HISTORY — DX: Personal history of other diseases of the digestive system: Z87.19

## 2016-05-02 IMAGING — CR DG LUMBAR SPINE COMPLETE 4+V
5 series · 5 of 5 positions shown · non-contrast
Comparison: None.

CLINICAL DATA: 44-year-old male status post fall 15 feet from
ladder. Lumbar spine pain.

EXAM:
LUMBAR SPINE - COMPLETE 4+ VIEW

[l-spine ap]
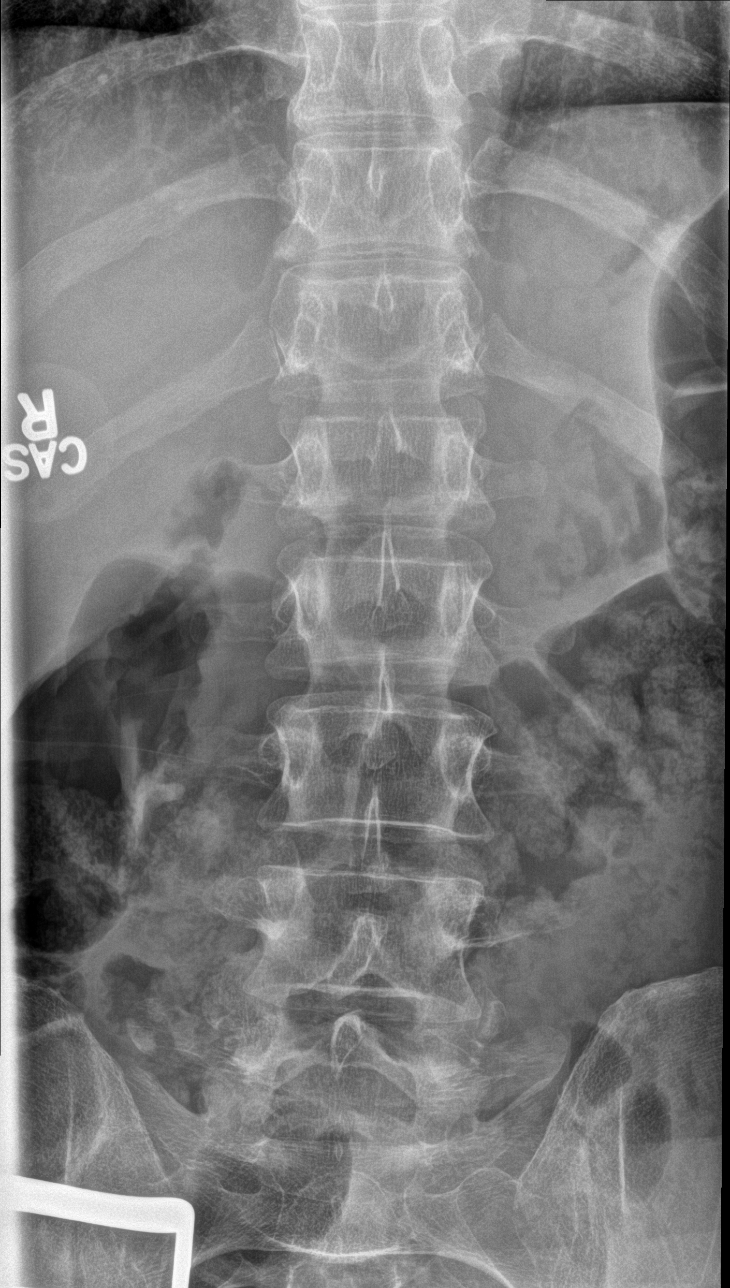

[l-spine obl (1 of 2)]
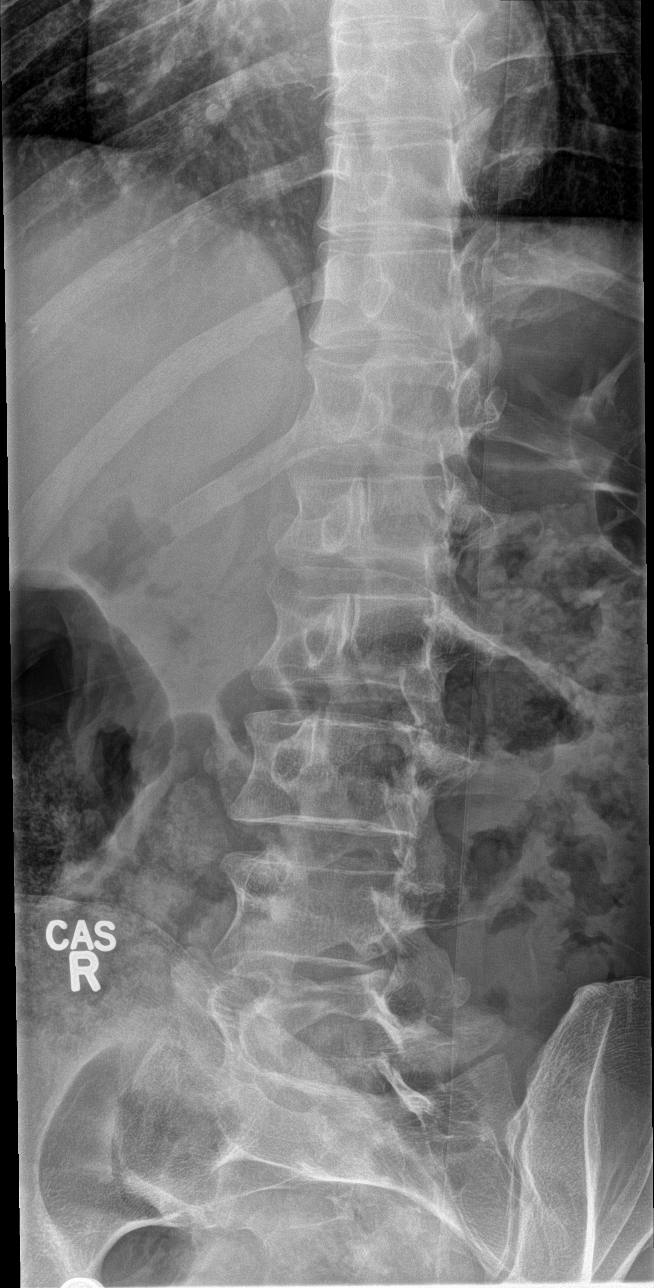

[l-spine obl (2 of 2)]
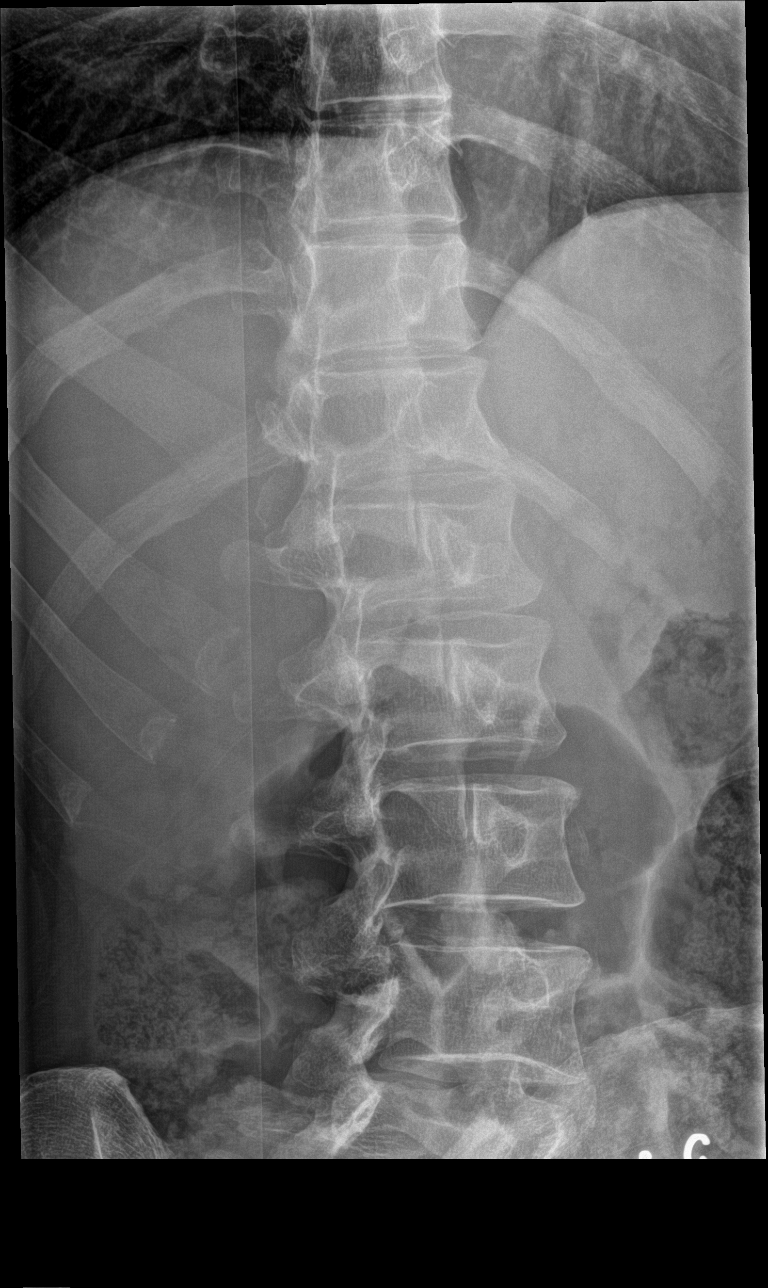

[l-spine lat]
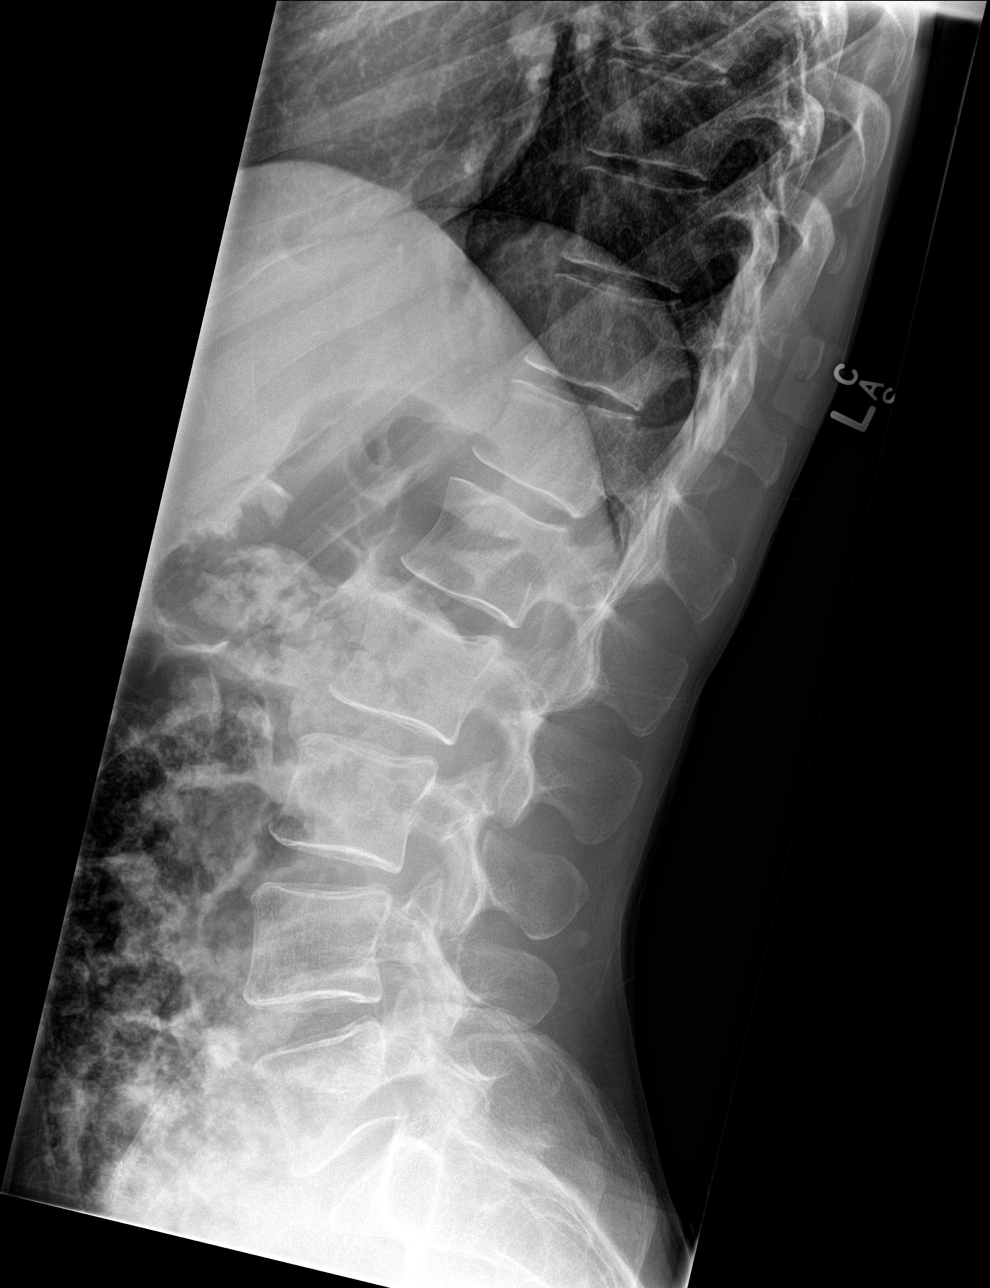

[l-spine spot]
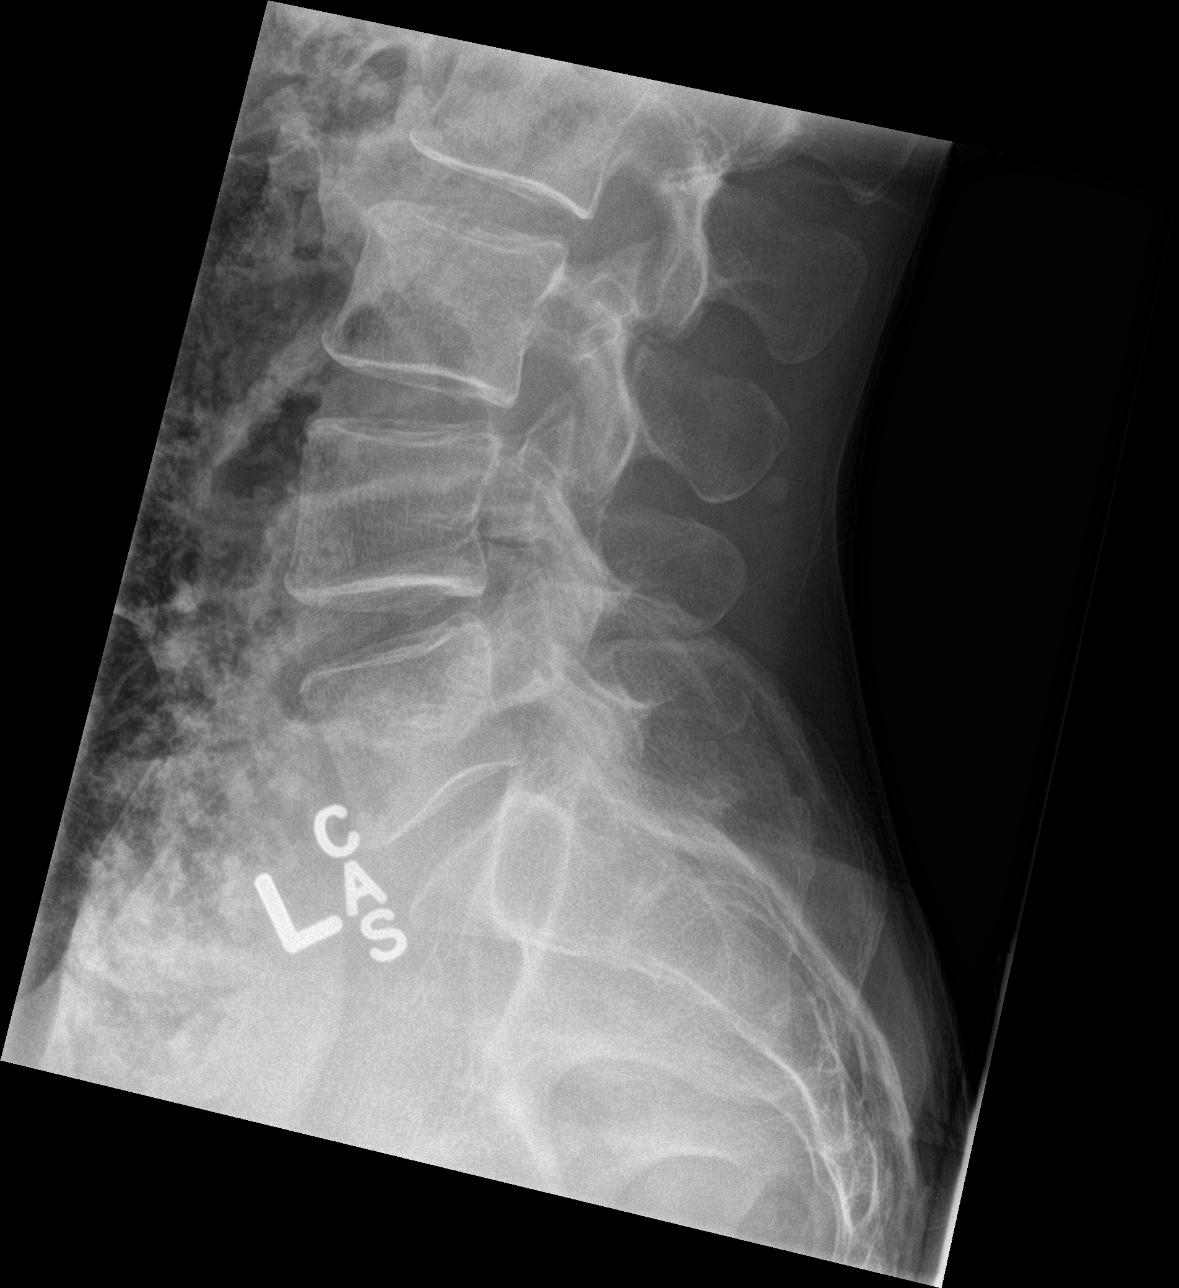

[5 of 5 positions shown; findings below may reference images not displayed]

FINDINGS: There is no evidence of lumbar spine fracture. Alignment is normal.
Intervertebral disc spaces are maintained.
IMPRESSION: Negative.

## 2016-07-01 ENCOUNTER — Emergency Department (HOSPITAL_COMMUNITY): Payer: BC Managed Care – PPO

## 2016-07-01 ENCOUNTER — Emergency Department (HOSPITAL_COMMUNITY)
Admission: EM | Admit: 2016-07-01 | Discharge: 2016-07-02 | Disposition: A | Discharge status: Home / Self Care | Payer: BC Managed Care – PPO | Attending: Physician Assistant | Admitting: Physician Assistant

## 2016-07-01 ENCOUNTER — Encounter (HOSPITAL_COMMUNITY): Payer: Self-pay

## 2016-07-01 DIAGNOSIS — W19XXXA Unspecified fall, initial encounter: Secondary | ICD-10-CM

## 2016-07-01 DIAGNOSIS — Y9355 Activity, bike riding: Secondary | ICD-10-CM | POA: Insufficient documentation

## 2016-07-01 DIAGNOSIS — Y92410 Unspecified street and highway as the place of occurrence of the external cause: Secondary | ICD-10-CM | POA: Diagnosis not present

## 2016-07-01 DIAGNOSIS — S6991XA Unspecified injury of right wrist, hand and finger(s), initial encounter: Secondary | ICD-10-CM | POA: Diagnosis present

## 2016-07-01 DIAGNOSIS — S52571A Other intraarticular fracture of lower end of right radius, initial encounter for closed fracture: Secondary | ICD-10-CM | POA: Insufficient documentation

## 2016-07-01 DIAGNOSIS — Y999 Unspecified external cause status: Secondary | ICD-10-CM | POA: Diagnosis not present

## 2016-07-01 MED ORDER — IBUPROFEN 400 MG PO TABS
800.0000 mg | ORAL_TABLET | Freq: Once | ORAL | Status: AC
  Administered 2016-07-02: 800 mg via ORAL
  Filled 2016-07-01: qty 2

## 2016-07-01 MED ORDER — OXYCODONE-ACETAMINOPHEN 5-325 MG PO TABS: 2.0000 | ORAL_TABLET | Freq: Once | ORAL | Status: DC

## 2016-07-01 MED ORDER — KETOROLAC TROMETHAMINE 60 MG/2ML IM SOLN
60.0000 mg | Freq: Once | INTRAMUSCULAR | Status: AC
  Administered 2016-07-02: 60 mg via INTRAMUSCULAR
  Filled 2016-07-01: qty 2
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
## 2016-07-02 MED ORDER — NAPROXEN 500 MG PO TABS: 500.0000 mg | ORAL_TABLET | Freq: Two times a day (BID) | ORAL | 0 refills | Status: DC

## 2016-07-02 MED ORDER — OXYCODONE-ACETAMINOPHEN 5-325 MG PO TABS: 1.0000 | ORAL_TABLET | Freq: Four times a day (QID) | ORAL | 0 refills | Status: DC | PRN

## 2016-07-02 NOTE — Progress Notes (Signed)
Orthopedic Tech Progress Note Patient Details:  Jermaine Rice September 05, 1971 [id number]  Ortho Devices Type of Ortho Device: Sugartong splint Ortho Device/Splint Location: rue Ortho Device/Splint Interventions: Ordered, Application   Karolee Stamps 07/02/2016, 12:46 AM

## 2016-07-02 NOTE — Discharge Instructions (Signed)
Please read and follow all provided instructions.  Your diagnoses today include:  1. Other closed intra-articular fracture of distal end of right radius, initial encounter   2. Fall     Tests performed today include:  An x-ray of the affected area - shows broken distal radius bone  Vital signs. See below for your results today.   Medications prescribed:   Percocet (oxycodone/acetaminophen) - narcotic pain medication  DO NOT drive or perform any activities that require you to be awake and alert because this medicine can make you drowsy. BE VERY CAREFUL not to take multiple medicines containing Tylenol (also called acetaminophen). Doing so can lead to an overdose which can damage your liver and cause liver failure and possibly death.   Naproxen - anti-inflammatory pain medication  Do not exceed 500mg  naproxen every 12 hours, take with food  You have been prescribed an anti-inflammatory medication or NSAID. Take with food. Take smallest effective dose for the shortest duration needed for your pain. Stop taking if you experience stomach pain or vomiting.   Take any prescribed medications only as directed.  Home care instructions:   Follow any educational materials contained in this packet  Follow R.I.C.E. Protocol:  R - rest your injury   I  - use ice on injury without applying directly to skin  C - compress injury with bandage or splint  E - elevate the injury as much as possible  Follow-up instructions: Please follow-up with the provided orthopedic physician this week.   Return instructions:   Please return if your fingers are numb or tingling, appear gray or blue, or you have severe pain (also elevate the arm and loosen splint or wrap if you were given one)  Please return to the Emergency Department if you experience worsening symptoms.   Please return if you have any other emergent concerns.  Additional Information:  Your vital signs today were: BP 122/83     Pulse 68    Temp 98.2 F (36.8 C)    Resp 20    Ht 5\' 10"  (1.778 m)    Wt 70.3 kg    SpO2 99%    BMI 22.24 kg/m  If your blood pressure (BP) was elevated above 135/85 this visit, please have this repeated by your doctor within one month. --------------

## 2017-08-08 ENCOUNTER — Encounter: Payer: Self-pay | Admitting: Interventional Cardiology

## 2017-08-19 ENCOUNTER — Encounter (INDEPENDENT_AMBULATORY_CARE_PROVIDER_SITE_OTHER): Payer: Self-pay

## 2017-08-19 ENCOUNTER — Encounter: Payer: Self-pay | Admitting: Interventional Cardiology

## 2017-08-19 ENCOUNTER — Ambulatory Visit: Payer: BC Managed Care – PPO | Admitting: Interventional Cardiology

## 2017-08-19 VITALS — BP 110/58 | HR 67 | Ht 70.0 in | Wt 147.6 lb

## 2017-08-19 DIAGNOSIS — R072 Precordial pain: Secondary | ICD-10-CM
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
## 2017-08-19 NOTE — Patient Instructions (Signed)
Medication Instructions:  Your physician recommends that you continue on your current medications as directed. Please refer to the Current Medication list given to you today.   Labwork: None ordered  Testing/Procedures: None ordered  Follow-Up: Your physician wants you to follow-up AS NEEDED  Any Other Special Instructions Will Be Listed Below (If Applicable).     If you need a refill on your cardiac medications before your next appointment, please call your pharmacy.   

## 2019-12-04 ENCOUNTER — Ambulatory Visit: Payer: BC Managed Care – PPO | Attending: Internal Medicine

## 2019-12-04 DIAGNOSIS — Z23 Encounter for immunization: Secondary | ICD-10-CM | POA: Insufficient documentation

## 2019-12-04 NOTE — Progress Notes (Signed)
   Covid-19 Vaccination Clinic  Name:  Jermaine Rice    MRN: [id number] DOB: 10/19/1970  12/04/2019  [name] was observed post Covid-19 immunization for 15 minutes without incident. He was provided with Vaccine Information Sheet and instruction to access the V-Safe system.   [name] was instructed to call 911 with any severe reactions post vaccine: Marland Kitchen Difficulty breathing  . Swelling of face and throat  . A fast heartbeat  . A bad rash all over body  . Dizziness and weakness   Immunizations Administered    Name Date Dose VIS Date Route   Pfizer COVID-19 Vaccine 12/04/2019  8:07 AM 0.3 mL 09/10/2019 Intramuscular   Manufacturer: [place]   Lot: [id number]   [place]: [id number]

## 2019-12-25 ENCOUNTER — Ambulatory Visit: Payer: BC Managed Care – PPO | Attending: Internal Medicine

## 2019-12-25 DIAGNOSIS — Z23 Encounter for immunization: Secondary | ICD-10-CM

## 2019-12-25 NOTE — Progress Notes (Signed)
   Covid-19 Vaccination Clinic  Name:  Jermaine Rice    MRN: [id number] DOB: May 01, 1971  12/25/2019  [name] was observed post Covid-19 immunization for 15 minutes without incident. He was provided with Vaccine Information Sheet and instruction to access the V-Safe system.   [name] was instructed to call 911 with any severe reactions post vaccine: Marland Kitchen Difficulty breathing  . Swelling of face and throat  . A fast heartbeat  . A bad rash all over body  . Dizziness and weakness   Immunizations Administered    Name Date Dose VIS Date Route   Pfizer COVID-19 Vaccine 12/25/2019  8:22 AM 0.3 mL 09/10/2019 Intramuscular   Manufacturer: [place]   Lot: [id number]   [place]: [id number]

## 2019-12-27 ENCOUNTER — Ambulatory Visit: Payer: BC Managed Care – PPO

## 2020-09-13 ENCOUNTER — Ambulatory Visit: Payer: BC Managed Care – PPO | Admitting: Podiatry

## 2020-09-13 ENCOUNTER — Encounter: Payer: Self-pay | Admitting: Podiatry

## 2020-09-13 ENCOUNTER — Ambulatory Visit (INDEPENDENT_AMBULATORY_CARE_PROVIDER_SITE_OTHER): Payer: BC Managed Care – PPO

## 2020-09-13 ENCOUNTER — Other Ambulatory Visit: Payer: Self-pay

## 2020-09-13 DIAGNOSIS — M778 Other enthesopathies, not elsewhere classified: Secondary | ICD-10-CM

## 2020-09-13 MED ORDER — DICLOFENAC SODIUM 75 MG PO TBEC: 75.0000 mg | DELAYED_RELEASE_TABLET | Freq: Two times a day (BID) | ORAL | 2 refills | Status: AC

## 2020-09-13 MED ORDER — TRIAMCINOLONE ACETONIDE 10 MG/ML IJ SUSP
10.0000 mg | Freq: Once | INTRAMUSCULAR | Status: AC
  Administered 2020-09-13: 10 mg

## 2020-09-15 NOTE — Progress Notes (Signed)
Subjective:   Patient ID: Jermaine Rice, male   DOB: 49 y.o.   MRN: [id number]   HPI Patient presents stating he has developed a lot of pain in the forefoot right and states that he had run on the beach and the pain started after that approximately 3 months ago.  States it sore and makes a lot of his activities difficult and patient does not smoke likes to be active   Review of Systems  All other systems reviewed and are negative.       Objective:  Physical Exam Vitals and nursing note reviewed.  Constitutional:      Appearance: He is well-developed and well-nourished.  Cardiovascular:     Pulses: Intact distal pulses.  Pulmonary:     Effort: Pulmonary effort is normal.  Musculoskeletal:        General: Normal range of motion.  Skin:    General: Skin is warm.  Neurological:     Mental Status: He is alert.     Neurovascular status intact muscle strength was found to be adequate range of motion adequate with exquisite discomfort second MPJ right with inflammation fluid around the joint surface with patient noted to have swelling in the area.  No proximal pain noted and good digital perfusion well oriented x3     Assessment:  Inflammatory capsulitis second MPJ right with fluid buildup     Plan:  H&P reviewed condition and recommended conservative treatment.  Today I went ahead I did a sterile block of the area aspirated the second MPJ getting out a small amount of clear fluid and injected quarter cc dexamethasone Kenalog and applied thick padding to reduce pressure off the joint along with rigid bottom shoes.  Patient will be seen back to recheck  X-rays indicate that there is no signs of fracture no signs of metatarsal parabola issues with this patient
